# Patient Record
Sex: Female | Born: 2005 | Race: Black or African American | Hispanic: Yes | Marital: Single | Smoking: Never smoker
Health system: Southern US, Community
[De-identification: ages and names within clinical notes are randomized; demographics above are authoritative.]

## PROBLEM LIST (undated history)

## (undated) DIAGNOSIS — R7303 Prediabetes: Secondary | ICD-10-CM

## (undated) DIAGNOSIS — L709 Acne, unspecified: Secondary | ICD-10-CM

## (undated) DIAGNOSIS — Z789 Other specified health status: Secondary | ICD-10-CM

## (undated) HISTORY — DX: Prediabetes: R73.03

## (undated) HISTORY — DX: Other specified health status: Z78.9

---
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
## 2023-02-13 ENCOUNTER — Ambulatory Visit: Payer: BC Managed Care – PPO | Admitting: Radiology

## 2023-11-27 ENCOUNTER — Encounter (INDEPENDENT_AMBULATORY_CARE_PROVIDER_SITE_OTHER): Payer: Self-pay

## 2023-11-28 ENCOUNTER — Encounter (INDEPENDENT_AMBULATORY_CARE_PROVIDER_SITE_OTHER): Payer: Self-pay

## 2024-01-31 ENCOUNTER — Ambulatory Visit (INDEPENDENT_AMBULATORY_CARE_PROVIDER_SITE_OTHER)

## 2024-01-31 ENCOUNTER — Ambulatory Visit (INDEPENDENT_AMBULATORY_CARE_PROVIDER_SITE_OTHER): Payer: Self-pay | Admitting: Family

## 2024-01-31 VITALS — BP 122/79 | HR 102 | Temp 98.4°F | Resp 16 | Ht 63.0 in | Wt 114.6 lb

## 2024-01-31 DIAGNOSIS — Z131 Encounter for screening for diabetes mellitus: Secondary | ICD-10-CM | POA: Diagnosis not present

## 2024-01-31 DIAGNOSIS — Z130 Encounter for screening for diseases of the blood and blood-forming organs and certain disorders involving the immune mechanism: Secondary | ICD-10-CM | POA: Diagnosis not present

## 2024-01-31 DIAGNOSIS — Z13228 Encounter for screening for other metabolic disorders: Secondary | ICD-10-CM | POA: Diagnosis not present

## 2024-01-31 DIAGNOSIS — M25551 Pain in right hip: Secondary | ICD-10-CM

## 2024-01-31 DIAGNOSIS — Z0000 Encounter for general adult medical examination without abnormal findings: Secondary | ICD-10-CM

## 2024-01-31 DIAGNOSIS — Z13220 Encounter for screening for lipoid disorders: Secondary | ICD-10-CM

## 2024-01-31 DIAGNOSIS — Z1329 Encounter for screening for other suspected endocrine disorder: Secondary | ICD-10-CM

## 2024-01-31 NOTE — Progress Notes (Signed)
 Patient ID: DOHA Lam, female    DOB: 09/07/2005  MRN: [id number]  CC: Annual Exam  Subjective: Crystal Lam is a 18 y.o. female who presents for annual exam. She is accompanied by her mother.  Her concerns today include:  - Intermittent right hip pain. Denies recent trauma/injury and red flag symptoms.   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 84 tablet 4   omeprazole  (PRILOSEC) 20 MG capsule Take 1 capsule (20 mg total) by mouth daily. (Patient not taking: Reported on 01/01/2022) 90 capsule 0   No current facility-administered medications on file prior to visit.    No Known Allergies  Social History   Socioeconomic History   Marital status: Single    Spouse name: Not on file   Number of children: Not on file   Years of education: Not on file   Highest education level: 12th grade  Occupational History   Not on file  Tobacco Use   Smoking status: Never    Passive exposure: Never   Smokeless tobacco: Never  Vaping Use   Vaping status: Never Used  Substance and Sexual Activity   Alcohol use: Never   Drug use: Never   Sexual activity: Never    Partners: Male    Comment: virgin, menarche 18yo  Other Topics Concern   Not on file  Social History Narrative   Lives with mom, dad, and brother.    Patient is a sophomore in college   Social Drivers of Health   Financial Resource Strain: Low Risk  (01/31/2024)   Overall Financial Resource Strain (CARDIA)    Difficulty of Paying Living  Expenses: Not hard at all  Food Insecurity: No Food Insecurity (01/31/2024)   Hunger Vital Sign    Worried About Running Out of Food in the Last Year: Never true    Ran Out of Food in the Last Year: Never true  Transportation Needs: No Transportation Needs (01/31/2024)   PRAPARE - Administrator, Civil Service (Medical): No    Lack of Transportation (Non-Medical): No  Physical Activity: Insufficiently Active (01/31/2024)   Exercise Vital Sign    Days of Exercise per Week: 2 days    Minutes of Exercise per Session: 60 min  Stress: No Stress Concern Present (01/31/2024)   [employer] of Occupational Health - Occupational Stress Questionnaire    Feeling of Stress: Not at all  Social Connections: Moderately Isolated (01/31/2024)   Social Connection and Isolation Panel    Frequency of Communication with Friends and Family: More than three times a week    Frequency of Social Gatherings with Friends and Family: More than three times a week    Attends Religious Services: More than 4 times per year    Active Member of [employer] or Organizations: No    Attends Banker Meetings: Never    Marital Status: Never married  Intimate Partner Violence: Not At Risk (01/31/2024)   Humiliation, Afraid, Rape, and Kick questionnaire    Fear of Current or Ex-Partner:  No    Emotionally Abused: No    Physically Abused: No    Sexually Abused: No    Family History  Problem Relation Age of Onset   Hypertension Father    Diabetes Paternal Aunt    Diabetes Paternal Uncle    Breast cancer Maternal Grandmother    Breast cancer Paternal Grandmother    Diabetes Paternal Grandfather    Heart attack Paternal Grandfather     Past Surgical History:  Procedure Laterality Date   LAPAROSCOPIC APPENDECTOMY  02/16/2012   Procedure: APPENDECTOMY LAPAROSCOPIC;  Surgeon: CHRISTELLA. Julietta Millman, MD;  Location: [facility] OR;  Service: Pediatrics;  Laterality: N/A;    ROS: Review of Systems Negative except  as stated above  PHYSICAL EXAM: BP 122/79   Pulse (!) 102   Temp 98.4 F (36.9 C) (Oral)   Resp 16   Ht 5' 3 (1.6 m)   Wt 114 lb 9.6 oz (52 kg)   LMP 01/28/2024 (Exact Date)   SpO2 98%   BMI 20.30 kg/m   Physical Exam HENT:     Head: Normocephalic and atraumatic.     Right Ear: Tympanic membrane, ear canal and external ear normal.     Left Ear: Tympanic membrane, ear canal and external ear normal.     Nose: Nose normal.     Mouth/Throat:     Mouth: Mucous membranes are moist.     Pharynx: Oropharynx is clear.  Eyes:     Extraocular Movements: Extraocular movements intact.     Conjunctiva/sclera: Conjunctivae normal.     Pupils: Pupils are equal, round, and reactive to light.  Neck:     Thyroid : No thyroid  mass, thyromegaly or thyroid  tenderness.  Cardiovascular:     Rate and Rhythm: Tachycardia present.     Pulses: Normal pulses.     Heart sounds: Normal heart sounds.  Pulmonary:     Effort: Pulmonary effort is normal.     Breath sounds: Normal breath sounds.  Chest:     Comments: Patient declined. Abdominal:     General: Bowel sounds are normal.     Palpations: Abdomen is soft.  Genitourinary:    Comments: Patient declined. Musculoskeletal:        General: Normal range of motion.     Right shoulder: Normal.     Left shoulder: Normal.     Right upper arm: Normal.     Left upper arm: Normal.     Right elbow: Normal.     Left elbow: Normal.     Right forearm: Normal.     Left forearm: Normal.     Right wrist: Normal.     Left wrist: Normal.     Right hand: Normal.     Left hand: Normal.     Cervical back: Normal, normal range of motion and neck supple.     Thoracic back: Normal.     Lumbar back: Normal.     Right hip: Normal.     Left hip: Normal.     Right upper leg: Normal.     Left upper leg: Normal.     Right knee: Normal.     Left knee: Normal.     Right lower leg: Normal.     Left lower leg: Normal.     Right ankle: Normal.     Left ankle:  Normal.     Right foot: Normal.     Left foot: Normal.  Skin:    General: Skin is warm and  dry.     Capillary Refill: Capillary refill takes less than 2 seconds.  Neurological:     General: No focal deficit present.     Mental Status: She is alert and oriented to person, place, and time.  Psychiatric:        Mood and Affect: Mood normal.        Behavior: Behavior normal.     ASSESSMENT AND PLAN: 1. Annual physical exam (Primary) - Counseled on 150 minutes of exercise per week as tolerated, healthy eating (including decreased daily intake of saturated fats, cholesterol, added sugars, sodium), STI prevention, and routine healthcare maintenance.  2. Screening for metabolic disorder - Routine screening.  - CMP14+EGFR  3. Screening for deficiency anemia - Routine screening.  - CBC  4. Diabetes mellitus screening - Routine screening.  - Hemoglobin A1c  5. Screening cholesterol level - Routine screening.  - Lipid panel  6. Thyroid  disorder screen - Routine screening.  - TSH  7. Pain of right hip - Patient declined pharmacological therapy.  - DG Hip Unilat for evaluation.  - Follow-up with primary provider as scheduled.  - DG Hip Unilat W OR W/O Pelvis 2-3 Views Right; Future   Patient was given the opportunity to ask questions.  Patient verbalized understanding of the plan and was able to repeat key elements of the plan. Patient was given clear instructions to go to Emergency Department or return to medical center if symptoms don't improve, worsen, or new problems develop.The patient verbalized understanding.   Orders Placed This Encounter  Procedures   DG Hip Unilat W OR W/O Pelvis 2-3 Views Right   CBC   Lipid panel   CMP14+EGFR   Hemoglobin A1c   TSH   Return in about 1 year (around 01/30/2025) for Physical per patient preference.  Greig JINNY Chute, NP

## 2024-02-01 LAB — CMP14+EGFR
ALT: 9 IU/L (ref 0–32)
AST: 19 IU/L (ref 0–40)
Albumin: 4.3 g/dL (ref 4.0–5.0)
Alkaline Phosphatase: 73 IU/L (ref 42–106)
BUN/Creatinine Ratio: 14 (ref 9–23)
BUN: 9 mg/dL (ref 6–20)
Bilirubin Total: 0.2 mg/dL (ref 0.0–1.2)
CO2: 23 mmol/L (ref 20–29)
Calcium: 9.2 mg/dL (ref 8.7–10.2)
Chloride: 103 mmol/L (ref 96–106)
Creatinine, Ser: 0.65 mg/dL (ref 0.57–1.00)
Globulin, Total: 3.1 g/dL (ref 1.5–4.5)
Glucose: 80 mg/dL (ref 70–99)
Potassium: 4.1 mmol/L (ref 3.5–5.2)
Sodium: 141 mmol/L (ref 134–144)
Total Protein: 7.4 g/dL (ref 6.0–8.5)
eGFR: 129 mL/min/1.73 (ref >59)

## 2024-02-01 LAB — CBC
Hematocrit: 40.2 % (ref 34.0–46.6)
Hemoglobin: 11.8 g/dL (ref 11.1–15.9)
MCH: 26.9 pg (ref 26.6–33.0)
MCHC: 29.4 g/dL — ABNORMAL LOW (ref 31.5–35.7)
MCV: 92 fL (ref 79–97)
Platelets: 231 x10E3/uL (ref 150–450)
RBC: 4.39 x10E6/uL (ref 3.77–5.28)
RDW: 14.5 % (ref 11.7–15.4)
WBC: 5.5 x10E3/uL (ref 3.4–10.8)

## 2024-02-01 LAB — HEMOGLOBIN A1C
Est. average glucose Bld gHb Est-mCnc: 111 mg/dL
Hgb A1c MFr Bld: 5.5 % (ref 4.8–5.6)

## 2024-02-01 LAB — LIPID PANEL
Chol/HDL Ratio: 2.6 ratio (ref 0.0–4.4)
Cholesterol, Total: 228 mg/dL — ABNORMAL HIGH (ref 100–199)
HDL: 88 mg/dL (ref >39)
LDL Chol Calc (NIH): 108 mg/dL — ABNORMAL HIGH (ref 0–99)
Triglycerides: 188 mg/dL — ABNORMAL HIGH (ref 0–149)
VLDL Cholesterol Cal: 32 mg/dL (ref 5–40)

## 2024-02-01 LAB — TSH: TSH: 2.74 u[IU]/mL (ref 0.450–4.500)

## 2024-02-03 ENCOUNTER — Ambulatory Visit: Payer: Self-pay | Admitting: Family

## 2024-02-03 DIAGNOSIS — E785 Hyperlipidemia, unspecified: Secondary | ICD-10-CM

## 2024-02-03 MED ORDER — ATORVASTATIN CALCIUM 20 MG PO TABS: 20.0000 mg | ORAL_TABLET | Freq: Every day | ORAL | 0 refills | Status: AC

## 2024-02-10 ENCOUNTER — Other Ambulatory Visit: Payer: Self-pay | Admitting: Radiology

## 2024-02-10 DIAGNOSIS — N921 Excessive and frequent menstruation with irregular cycle: Secondary | ICD-10-CM

## 2024-02-11 ENCOUNTER — Other Ambulatory Visit: Payer: Self-pay | Admitting: Radiology

## 2024-02-11 DIAGNOSIS — N921 Excessive and frequent menstruation with irregular cycle: Secondary | ICD-10-CM

## 2024-02-11 NOTE — Addendum Note (Signed)
 Addended by: BRUTUS KATE SAILOR on: 02/11/2024 03:17 PM   Modules accepted: Orders

## 2024-02-11 NOTE — Telephone Encounter (Signed)
 Med refill request: Crystal Lam  Last OV: 11/14/22 Last AEX: 09/11/21 Next AEX: none scheduled Last MMG (if hormonal med) n/a Last refill: 07/24/2023 Refill denied.  Needs AEX.  Message sent to front desk to schedule AEX.

## 2024-02-11 NOTE — Telephone Encounter (Signed)
 Patient left message on triage, scheduled for AEX 04/08/24. Request refill.   Rx pended. #[id number]/1RF

## 2024-02-12 MED ORDER — DROSPIRENONE-ETHINYL ESTRADIOL 3-0.02 MG PO TABS: 1.0000 | ORAL_TABLET | Freq: Every day | ORAL | 1 refills | Status: AC

## 2024-02-12 NOTE — Telephone Encounter (Signed)
 Medication was refilled yesterday. Will refuse.   Medication refill request: loryn Last OV:  11/14/22 Next AEX: 04/08/24 Last MMG (if hormonal medication request): na Refill authorized: Medication was refilled yesterday. Will refuse.

## 2024-04-08 ENCOUNTER — Ambulatory Visit: Admitting: Radiology

## 2025-02-01 ENCOUNTER — Encounter: Admitting: Family
